# Patient Record
Sex: Male | Born: 1955 | Race: White | Hispanic: No | Marital: Married | Smoking: Never smoker
Health system: Southern US, Community
[De-identification: ages and names within clinical notes are randomized; demographics above are authoritative.]

## PROBLEM LIST (undated history)

## (undated) DIAGNOSIS — E785 Hyperlipidemia, unspecified: Secondary | ICD-10-CM

## (undated) HISTORY — PX: INGUINAL HERNIA REPAIR: SUR1180

## (undated) HISTORY — PX: VASECTOMY: SHX75

## (undated) HISTORY — DX: Hyperlipidemia, unspecified: E78.5

## (undated) HISTORY — PX: WRIST SURGERY: SHX841

---

## 2012-09-30 ENCOUNTER — Encounter: Payer: Self-pay | Admitting: Sports Medicine

## 2012-09-30 ENCOUNTER — Ambulatory Visit (INDEPENDENT_AMBULATORY_CARE_PROVIDER_SITE_OTHER): Payer: Self-pay | Admitting: Sports Medicine

## 2012-09-30 VITALS — BP 148/96 | HR 65 | Wt 194.0 lb

## 2012-09-30 DIAGNOSIS — Z0000 Encounter for general adult medical examination without abnormal findings: Secondary | ICD-10-CM | POA: Insufficient documentation

## 2012-09-30 DIAGNOSIS — Z299 Encounter for prophylactic measures, unspecified: Secondary | ICD-10-CM

## 2012-09-30 DIAGNOSIS — H02829 Cysts of unspecified eye, unspecified eyelid: Secondary | ICD-10-CM

## 2012-09-30 DIAGNOSIS — H02822 Cysts of right lower eyelid: Secondary | ICD-10-CM | POA: Insufficient documentation

## 2012-09-30 DIAGNOSIS — E785 Hyperlipidemia, unspecified: Secondary | ICD-10-CM

## 2012-09-30 MED ORDER — ATORVASTATIN CALCIUM 20 MG PO TABS: 20.0000 mg | ORAL_TABLET | Freq: Every day | ORAL | Status: DC

## 2012-09-30 NOTE — Assessment & Plan Note (Signed)
I am certainly happy to provide a referral to our oculofacial plastic surgeon.  He desires to defer this until a later date.

## 2012-09-30 NOTE — Assessment & Plan Note (Signed)
Starting lipitor 20. Recheck in 3 months.

## 2012-09-30 NOTE — Assessment & Plan Note (Signed)
Up to date on most preventive measures.

## 2012-09-30 NOTE — Progress Notes (Signed)
Subjective:    CC: Establish care.   HPI:  Hyperlipidemia: Was on simvastatin 10 mg, has never had his cholesterol under excellent control.  Nodule on right eyelid: This is been present for a long time, wonders what this could be, and if he needs it taken off.  Past medical history, Surgical history, Family history, Social history, Allergies, and medications have been entered into the medical record, reviewed, and no changes needed.   Review of Systems: No headache, visual changes, nausea, vomiting, diarrhea, constipation, dizziness, abdominal pain, skin rash, fevers, chills, night sweats, swollen lymph nodes, weight loss, chest pain, body aches, joint swelling, muscle aches, shortness of breath, mood changes, visual or auditory hallucinations.  Objective:    General: Well Developed, well nourished, and in no acute distress.  Neuro: Alert and oriented x3, extra-ocular muscles intact, sensation grossly intact.  HEENT: Normocephalic, atraumatic, pupils equal round reactive to light, neck supple, no masses, no lymphadenopathy, thyroid nonpalpable, small nontender nodule palpable on the right lower eyelid near the medial canthus. Skin: Warm and dry, no rashes noted.  Cardiac: Regular rate and rhythm, no murmurs rubs or gallops.  Respiratory: Clear to auscultation bilaterally. Not using accessory muscles, speaking in full sentences.  Abdominal: Soft, nontender, nondistended, positive bowel sounds, no masses, no organomegaly.  Musculoskeletal: Shoulder, elbow, wrist, hip, knee, ankle stable, and with full range of motion.  Impression and Recommendations:    The patient was counselled, risk factors were discussed, anticipatory guidance given.

## 2013-02-02 ENCOUNTER — Ambulatory Visit (INDEPENDENT_AMBULATORY_CARE_PROVIDER_SITE_OTHER): Payer: PRIVATE HEALTH INSURANCE | Admitting: Sports Medicine

## 2013-02-02 ENCOUNTER — Encounter: Payer: Self-pay | Admitting: Sports Medicine

## 2013-02-02 VITALS — BP 112/71 | HR 67 | Temp 98.0°F | Wt 197.0 lb

## 2013-02-02 DIAGNOSIS — K3189 Other diseases of stomach and duodenum: Secondary | ICD-10-CM

## 2013-02-02 DIAGNOSIS — R1013 Epigastric pain: Secondary | ICD-10-CM | POA: Insufficient documentation

## 2013-02-02 LAB — LIPID PANEL
Cholesterol: 167 mg/dL (ref 0–200)
HDL: 66 mg/dL (ref >39)
LDL Cholesterol: 87 mg/dL (ref 0–99)
Total CHOL/HDL Ratio: 2.5 Ratio
Triglycerides: 69 mg/dL (ref <150)
VLDL: 14 mg/dL (ref 0–40)

## 2013-02-02 LAB — COMPREHENSIVE METABOLIC PANEL WITH GFR
Albumin: 4.4 g/dL (ref 3.5–5.2)
Alkaline Phosphatase: 49 U/L (ref 39–117)
BUN: 18 mg/dL (ref 6–23)
CO2: 26 meq/L (ref 19–32)
Calcium: 9.4 mg/dL (ref 8.4–10.5)
Glucose, Bld: 93 mg/dL (ref 70–99)
Potassium: 5 meq/L (ref 3.5–5.3)
Sodium: 137 meq/L (ref 135–145)
Total Protein: 6.5 g/dL (ref 6.0–8.3)

## 2013-02-02 LAB — CBC
HCT: 40.2 % (ref 36.0–46.0)
Hemoglobin: 13.9 g/dL (ref 12.0–15.0)
MCH: 30.5 pg (ref 26.0–34.0)
MCHC: 34.6 g/dL (ref 30.0–36.0)
MCV: 88.2 fL (ref 78.0–100.0)
Platelets: 205 10*3/uL (ref 150–400)
RBC: 4.56 MIL/uL (ref 3.87–5.11)
RDW: 13.6 % (ref 11.5–15.5)
WBC: 6.1 10*3/uL (ref 4.0–10.5)

## 2013-02-02 LAB — COMPREHENSIVE METABOLIC PANEL
ALT: 41 U/L — ABNORMAL HIGH (ref 0–35)
AST: 31 U/L (ref 0–37)
Chloride: 102 mEq/L (ref 96–112)
Creat: 1.22 mg/dL — ABNORMAL HIGH (ref 0.50–1.10)
Total Bilirubin: 0.6 mg/dL (ref 0.3–1.2)

## 2013-02-02 MED ORDER — SUCRALFATE 1 G PO TABS: 1.0000 g | ORAL_TABLET | Freq: Four times a day (QID) | ORAL | Status: DC

## 2013-02-02 MED ORDER — ESOMEPRAZOLE MAGNESIUM 40 MG PO CPDR: 40.0000 mg | DELAYED_RELEASE_CAPSULE | Freq: Two times a day (BID) | ORAL | Status: DC

## 2013-02-02 NOTE — Patient Instructions (Addendum)
Gastritis, Adult Gastritis is soreness and swelling (inflammation) of the lining of the stomach. Gastritis can develop as a sudden onset (acute) or long-term (chronic) condition. If gastritis is not treated, it can lead to stomach bleeding and ulcers. CAUSES  Gastritis occurs when the stomach lining is weak or damaged. Digestive juices from the stomach then inflame the weakened stomach lining. The stomach lining may be weak or damaged due to viral or bacterial infections. One common bacterial infection is the Helicobacter pylori infection. Gastritis can also result from excessive alcohol consumption, taking certain medicines, or having too much acid in the stomach.  SYMPTOMS  In some cases, there are no symptoms. When symptoms are present, they may include:  Pain or a burning sensation in the upper abdomen.  Nausea.  Vomiting.  An uncomfortable feeling of fullness after eating. DIAGNOSIS  Your caregiver may suspect you have gastritis based on your symptoms and a physical exam. To determine the cause of your gastritis, your caregiver may perform the following:  Blood or stool tests to check for the H pylori bacterium.  Gastroscopy. A thin, flexible tube (endoscope) is passed down the esophagus and into the stomach. The endoscope has a light and camera on the end. Your caregiver uses the endoscope to view the inside of the stomach.  Taking a tissue sample (biopsy) from the stomach to examine under a microscope. TREATMENT  Depending on the cause of your gastritis, medicines may be prescribed. If you have a bacterial infection, such as an H pylori infection, antibiotics may be given. If your gastritis is caused by too much acid in the stomach, H2 blockers or antacids may be given. Your caregiver may recommend that you stop taking aspirin, ibuprofen, or other nonsteroidal anti-inflammatory drugs (NSAIDs). HOME CARE INSTRUCTIONS  Only take over-the-counter or prescription medicines as directed by  your caregiver.  If you were given antibiotic medicines, take them as directed. Finish them even if you start to feel better.  Drink enough fluids to keep your urine clear or pale yellow.  Avoid foods and drinks that make your symptoms worse, such as:  Caffeine or alcoholic drinks.  Chocolate.  Peppermint or mint flavorings.  Garlic and onions.  Spicy foods.  Citrus fruits, such as oranges, lemons, or limes.  Tomato-based foods such as sauce, chili, salsa, and pizza.  Fried and fatty foods.  Eat small, frequent meals instead of large meals. SEEK IMMEDIATE MEDICAL CARE IF:   You have black or dark red stools.  You vomit blood or material that looks like coffee grounds.  You are unable to keep fluids down.  Your abdominal pain gets worse.  You have a fever.  You do not feel better after 1 week.  You have any other questions or concerns. MAKE SURE YOU:  Understand these instructions.  Will watch your condition.  Will get help right away if you are not doing well or get worse. Document Released: 09/02/2001 Document Revised: 03/09/2012 Document Reviewed: 10/22/2011 Exit[employer] Patient Information 2013 [employer].  

## 2013-02-02 NOTE — Progress Notes (Signed)
  Subjective:    CC: Followup  HPI: Epigastric pain: For the past few weeks Daryl Hayes has been drinking excessive amounts of alcohol as well as the entire pot of coffee daily. Since then he's noted some mild epigastric pain, associated with bloating and early satiety. He denies any vomiting, diarrhea, melena, hematochezia. No weight loss, no other constitutional symptoms. It is localized, doesn't radiate, mild to moderate.  Hyperlipidemia: No problems so far with Lipitor.  Past medical history, Surgical history, Family history not pertinant except as noted below, Social history, Allergies, and medications have been entered into the medical record, reviewed, and no changes needed.   Review of Systems: No fevers, chills, night sweats, weight loss, chest pain, or shortness of breath.   Objective:    General: Well Developed, well nourished, and in no acute distress.  Neuro: Alert and oriented x3, extra-ocular muscles intact, sensation grossly intact.  HEENT: Normocephalic, atraumatic, pupils equal round reactive to light, neck supple, no masses, no lymphadenopathy, thyroid nonpalpable.  Skin: Warm and dry, no rashes. Cardiac: Regular rate and rhythm, no murmurs rubs or gallops, no lower extremity edema.  Respiratory: Clear to auscultation bilaterally. Not using accessory muscles, speaking in full sentences. Abdomen: Soft, nontender, nondistended, normal bowel sounds, no palpable organomegaly.  Impression and Recommendations:

## 2013-02-02 NOTE — Assessment & Plan Note (Signed)
Adding H. pylori. Nexium twice a day, sucralfate. Return in one month.

## 2013-02-03 LAB — H. PYLORI ANTIBODY, IGG: H Pylori IgG: 0.49 {ISR}

## 2013-10-24 ENCOUNTER — Ambulatory Visit (INDEPENDENT_AMBULATORY_CARE_PROVIDER_SITE_OTHER): Payer: PRIVATE HEALTH INSURANCE | Admitting: Sports Medicine

## 2013-10-24 ENCOUNTER — Encounter: Payer: Self-pay | Admitting: Sports Medicine

## 2013-10-24 VITALS — BP 140/90 | HR 56 | Ht 68.0 in | Wt 192.0 lb

## 2013-10-24 DIAGNOSIS — K3189 Other diseases of stomach and duodenum: Secondary | ICD-10-CM

## 2013-10-24 DIAGNOSIS — R1013 Epigastric pain: Secondary | ICD-10-CM

## 2013-10-24 DIAGNOSIS — E785 Hyperlipidemia, unspecified: Secondary | ICD-10-CM

## 2013-10-24 DIAGNOSIS — M79609 Pain in unspecified limb: Secondary | ICD-10-CM

## 2013-10-24 DIAGNOSIS — M79669 Pain in unspecified lower leg: Secondary | ICD-10-CM | POA: Insufficient documentation

## 2013-10-24 DIAGNOSIS — L989 Disorder of the skin and subcutaneous tissue, unspecified: Secondary | ICD-10-CM | POA: Insufficient documentation

## 2013-10-24 MED ORDER — ESOMEPRAZOLE MAGNESIUM 40 MG PO CPDR: 40.0000 mg | DELAYED_RELEASE_CAPSULE | Freq: Every day | ORAL | Status: DC

## 2013-10-24 MED ORDER — ATORVASTATIN CALCIUM 20 MG PO TABS: 20.0000 mg | ORAL_TABLET | Freq: Every day | ORAL | Status: DC

## 2013-10-24 NOTE — Assessment & Plan Note (Signed)
Good response to Nexium. Refilling. Up-to-date on his colonoscopy.

## 2013-10-24 NOTE — Assessment & Plan Note (Signed)
Refilling atorvastatin

## 2013-10-24 NOTE — Assessment & Plan Note (Signed)
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 Custom orthotics have been shown to improve the symptom. He is training for a 13 mile trail run.

## 2013-10-24 NOTE — Assessment & Plan Note (Signed)
We will watch this for a month, if persistent we will biopsy it.

## 2013-10-24 NOTE — Progress Notes (Signed)
  Subjective:    CC: Followup  HPI: Dyspepsia: Excellent response to Nexium, unfortunately he stopped it or symptoms returned. He is up-to-date on his colon cancer screening. He also had no anemia on recent blood work, H. pylori antibody was negative.  Lower extremity pain: Bilateral, localized over the anterior compartment and posterior medial tibial border, he is currently training for a 13 mile trail run. It is moderate, persistent. When he gets pain over the anterior shin he does develop some footdrop. He also has an increased sense of pressure.  Past medical history, Surgical history, Family history not pertinant except as noted below, Social history, Allergies, and medications have been entered into the medical record, reviewed, and no changes needed.   Review of Systems: No fevers, chills, night sweats, weight loss, chest pain, or shortness of breath.   Objective:    General: Well Developed, well nourished, and in no acute distress.  Neuro: Alert and oriented x3, extra-ocular muscles intact, sensation grossly intact.  HEENT: Normocephalic, atraumatic, pupils equal round reactive to light, neck supple, no masses, no lymphadenopathy, thyroid nonpalpable.  Skin: Warm and dry, no rashes. Cardiac: Regular rate and rhythm, no murmurs rubs or gallops, no lower extremity edema.  Respiratory: Clear to auscultation bilaterally. Not using accessory muscles, speaking in full sentences. Shins: No discrete areas of tenderness to palpation. Hip abductor strength is excellent. No visible abnormalities.  Impression and Recommendations:

## 2013-10-25 ENCOUNTER — Encounter: Payer: Self-pay | Admitting: Sports Medicine

## 2013-10-25 ENCOUNTER — Ambulatory Visit (INDEPENDENT_AMBULATORY_CARE_PROVIDER_SITE_OTHER): Payer: PRIVATE HEALTH INSURANCE | Admitting: Sports Medicine

## 2013-10-25 VITALS — BP 122/71 | HR 74 | Ht 68.0 in | Wt 191.0 lb

## 2013-10-25 DIAGNOSIS — M79669 Pain in unspecified lower leg: Secondary | ICD-10-CM

## 2013-10-25 DIAGNOSIS — R1013 Epigastric pain: Secondary | ICD-10-CM

## 2013-10-25 MED ORDER — OMEPRAZOLE 40 MG PO CPDR: 40.0000 mg | DELAYED_RELEASE_CAPSULE | Freq: Two times a day (BID) | ORAL | Status: DC

## 2013-10-25 NOTE — Assessment & Plan Note (Signed)
Nexium was too expensive, switching to omeprazole.

## 2013-10-25 NOTE — Assessment & Plan Note (Signed)
Suspect medial tibial stress syndrome and some exertional anterior compartment syndrome. Custom orthotics as above. He can continue to drain for his 13 mile run.

## 2013-10-25 NOTE — Progress Notes (Signed)
    Patient was fitted for a : standard, cushioned, semi-rigid orthotic. The orthotic was heated and afterward the patient stood on the orthotic blank positioned on the orthotic stand. The patient was positioned in subtalar neutral position and 10 degrees of ankle dorsiflexion in a weight bearing stance. After completion of molding, a stable base was applied to the orthotic blank. The blank was ground to a stable position for weight bearing. Size:10 Base: Blue EVA Additional Posting and Padding: None The patient ambulated these, and they were very comfortable.  I spent 40 minutes with this patient, greater than 50% was face-to-face time counseling regarding the below diagnosis.   

## 2014-02-28 ENCOUNTER — Telehealth: Payer: Self-pay

## 2014-02-28 NOTE — Telephone Encounter (Signed)
Patient called stated that he is doing a run on the 19th on this month in [place] and he wanted to know if there is any medication that he should be taking because it will be a high altitude? Rhonda Cunningham,CMA

## 2014-02-28 NOTE — Telephone Encounter (Signed)
What is the altitude that he expects to be running at?

## 2014-03-01 MED ORDER — ACETAZOLAMIDE 125 MG PO TABS: ORAL_TABLET | ORAL | Status: DC

## 2014-03-01 NOTE — Telephone Encounter (Signed)
Spoke to patient gave him instructions on new medication as noted below. Jaimie Pippins,CMA

## 2014-03-01 NOTE — Telephone Encounter (Signed)
We are going to start Acetazolamide 125 mg twice a day started 24 hours before ascent, and continued 2 days after arrival at the peak.

## 2014-03-01 NOTE — Telephone Encounter (Signed)
Spoke to patient he stated that he will be going up to [id number]. Rhonda Cunningham,CMA

## 2014-04-09 ENCOUNTER — Other Ambulatory Visit: Payer: Self-pay | Admitting: Sports Medicine

## 2014-10-03 ENCOUNTER — Ambulatory Visit (INDEPENDENT_AMBULATORY_CARE_PROVIDER_SITE_OTHER): Payer: PRIVATE HEALTH INSURANCE | Admitting: Sports Medicine

## 2014-10-03 ENCOUNTER — Encounter: Payer: Self-pay | Admitting: Sports Medicine

## 2014-10-03 VITALS — BP 146/99 | HR 58 | Ht 68.0 in | Wt 191.0 lb

## 2014-10-03 DIAGNOSIS — B372 Candidiasis of skin and nail: Secondary | ICD-10-CM

## 2014-10-03 DIAGNOSIS — L0231 Cutaneous abscess of buttock: Secondary | ICD-10-CM

## 2014-10-03 MED ORDER — DOXYCYCLINE HYCLATE 100 MG PO TABS: 100.0000 mg | ORAL_TABLET | Freq: Two times a day (BID) | ORAL | Status: AC

## 2014-10-03 MED ORDER — CLOTRIMAZOLE-BETAMETHASONE 1-0.05 % EX CREA: 1.0000 "application " | TOPICAL_CREAM | Freq: Two times a day (BID) | CUTANEOUS | Status: DC

## 2014-10-03 NOTE — Patient Instructions (Signed)
Peri-Rectal Abscess  Your caregiver has diagnosed you as having a peri-rectal abscess. This is an infected area near the rectum that is filled with pus. If the abscess is near the surface of the skin, your caregiver may open (incise) the area and drain the pus.  HOME CARE INSTRUCTIONS    If your abscess was opened up and drained. A small piece of gauze may be placed in the opening so that it can drain. Do not remove the gauze unless directed by your caregiver.   A loose dressing may be placed over the abscess site. Change the dressing as often as necessary to keep it clean and dry.   After the drain is removed, the area may be washed with a gentle antiseptic (soap) four times per day.   A warm sitz bath, warm packs or heating pad may be used for pain relief, taking care not to burn yourself.   Return for a wound check in 1 day or as directed.   An "inflatable doughnut" may be used for sitting with added comfort. These can be purchased at a drugstore or medical supply house.   To reduce pain and straining with bowel movements, eat a high fiber diet with plenty of fruits and vegetables. Use stool softeners as recommended by your caregiver. This is especially important if narcotic type pain medications were prescribed as these may cause marked constipation.   Only take over-the-counter or prescription medicines for pain, discomfort, or fever as directed by your caregiver.  SEEK IMMEDIATE MEDICAL CARE IF:    You have increasing pain that is not controlled by medication.   There is increased inflammation (redness), swelling, bleeding, or drainage from the area.   An oral temperature above 102 F (38.9 C) develops.   You develop chills or generalized malaise (feel lethargic or feel "washed out").   You develop any new symptoms (problems) you feel may be related to your present problem.  Document Released: 09/05/2000 Document Revised: 12/01/2011 Document Reviewed: 09/05/2008  [employer] Patient Information  2015 [employer]. This information is not intended to replace advice given to you by your health care provider. Make sure you discuss any questions you have with your health care provider.

## 2014-10-03 NOTE — Assessment & Plan Note (Signed)
Present in the left gluteal region, nontender. This likely represents a skin abscess. Doxycycline for 10 days, return, if no better we will image and likely perform an incision and drainage.

## 2014-10-03 NOTE — Progress Notes (Signed)
  Subjective:    CC: Lump on buttock  HPI: This is a pleasant 59 year old male, for several weeks he's noted a minimally tender lump on his left buttock, approximately 2 inches from the anus. Symptoms are moderate, persistent.  Rash: Also localized over the anus and on the buttock's bilaterally, he was told by dermatologist in the past that this represents a fungal infection, and was given a cream that cleared it up.  Past medical history, Surgical history, Family history not pertinant except as noted below, Social history, Allergies, and medications have been entered into the medical record, reviewed, and no changes needed.   Review of Systems: No fevers, chills, night sweats, weight loss, chest pain, or shortness of breath.   Objective:    General: Well Developed, well nourished, and in no acute distress.  Neuro: Alert and oriented x3, extra-ocular muscles intact, sensation grossly intact.  HEENT: Normocephalic, atraumatic, pupils equal round reactive to light, neck supple, no masses, no lymphadenopathy, thyroid nonpalpable.  Skin: Warm and dry, no rashes. Cardiac: Regular rate and rhythm, no murmurs rubs or gallops, no lower extremity edema.  Respiratory: Clear to auscultation bilaterally. Not using accessory muscles, speaking in full sentences. Buttock: There is what appears to be a candidal intertrigo with scaling. There is also a palpable 2-3 cm nodule that is fluctuant, well-defined, and movable, but only minimally tender on the left buttock approximately 2 inches from the anus.  Impression and Recommendations:

## 2014-10-03 NOTE — Assessment & Plan Note (Signed)
Lotrisone cream twice a day.

## 2014-11-05 ENCOUNTER — Other Ambulatory Visit: Payer: Self-pay | Admitting: Sports Medicine

## 2014-11-06 ENCOUNTER — Ambulatory Visit (INDEPENDENT_AMBULATORY_CARE_PROVIDER_SITE_OTHER): Payer: PRIVATE HEALTH INSURANCE

## 2014-11-06 ENCOUNTER — Ambulatory Visit (INDEPENDENT_AMBULATORY_CARE_PROVIDER_SITE_OTHER): Payer: PRIVATE HEALTH INSURANCE | Admitting: Sports Medicine

## 2014-11-06 ENCOUNTER — Encounter: Payer: Self-pay | Admitting: Sports Medicine

## 2014-11-06 VITALS — BP 148/97 | HR 71 | Temp 98.1°F | Ht 68.0 in | Wt 190.0 lb

## 2014-11-06 DIAGNOSIS — R05 Cough: Secondary | ICD-10-CM

## 2014-11-06 DIAGNOSIS — R059 Cough, unspecified: Secondary | ICD-10-CM | POA: Insufficient documentation

## 2014-11-06 DIAGNOSIS — R062 Wheezing: Secondary | ICD-10-CM

## 2014-11-06 DIAGNOSIS — L0231 Cutaneous abscess of buttock: Secondary | ICD-10-CM

## 2014-11-06 MED ORDER — IPRATROPIUM-ALBUTEROL 20-100 MCG/ACT IN AERS: 1.0000 | INHALATION_SPRAY | Freq: Four times a day (QID) | RESPIRATORY_TRACT | Status: DC | PRN

## 2014-11-06 MED ORDER — METHYLPREDNISOLONE SODIUM SUCC 125 MG IJ SOLR: 125.0000 mg | Freq: Once | INTRAMUSCULAR | Status: DC

## 2014-11-06 MED ORDER — CEFTRIAXONE SODIUM 1 G IJ SOLR
1.0000 g | Freq: Once | INTRAMUSCULAR | Status: AC
  Administered 2014-11-06: 1 g via INTRAMUSCULAR

## 2014-11-06 MED ORDER — BENZONATATE 200 MG PO CAPS: 200.0000 mg | ORAL_CAPSULE | Freq: Three times a day (TID) | ORAL | Status: DC | PRN

## 2014-11-06 MED ORDER — PREDNISONE 50 MG PO TABS: 50.0000 mg | ORAL_TABLET | Freq: Every day | ORAL | Status: DC

## 2014-11-06 MED ORDER — SODIUM CHLORIDE 0.9 % IV SOLN: 125.0000 mg | Freq: Once | INTRAVENOUS | Status: DC

## 2014-11-06 MED ORDER — AZITHROMYCIN 250 MG PO TABS: ORAL_TABLET | ORAL | Status: DC

## 2014-11-06 MED ORDER — METHYLPREDNISOLONE SODIUM SUCC 125 MG IJ SOLR
125.0000 mg | Freq: Once | INTRAMUSCULAR | Status: AC
  Administered 2014-11-06: 125 mg via INTRAMUSCULAR

## 2014-11-06 NOTE — Assessment & Plan Note (Signed)
Resolved with antibiotics last month.

## 2014-11-06 NOTE — Assessment & Plan Note (Signed)
Significant inspiratory and expiratory wheezes with coarse sounds throughout, and significantly decreased expiratory phase. This likely does represent an acute bronchitis, he is not going to be able to get medicines until tomorrow or the day after so we are going to give him Solu-Medrol 125 and Rocephin 1 g intramuscular today. Nebulizer treatment given today, Atrovent and albuterol. Prednisone, azithromycin, chest x-ray, Tessalon Perles. Return to see me in one month, for reevaluation.

## 2014-11-06 NOTE — Progress Notes (Signed)
  Subjective:    CC:  Coughing  HPI: For the past week and a half Coery has had significant cough, productive, with mild malaise, without any rashes, GI symptoms, or constitutional symptoms. Symptoms are moderate, persistent.  Past medical history, Surgical history, Family history not pertinant except as noted below, Social history, Allergies, and medications have been entered into the medical record, reviewed, and no changes needed.   Review of Systems: No fevers, chills, night sweats, weight loss, chest pain, or shortness of breath.   Objective:    General: Well Developed, well nourished, and in no acute distress.  Neuro: Alert and oriented x3, extra-ocular muscles intact, sensation grossly intact.  HEENT: Normocephalic, atraumatic, pupils equal round reactive to light, neck supple, no masses, no lymphadenopathy, thyroid nonpalpable.  Skin: Warm and dry, no rashes. Cardiac: Regular rate and rhythm, no murmurs rubs or gallops, no lower extremity edema.  Respiratory: Coarse sounds bilaterally with inspiratory and expiratory wheezes and a significantly prolonged expiratory phase.. Not using accessory muscles, speaking in full sentences.  Solu-Medrol, Rocephin, and albuterol and Atrovent nebs given today in the office.  Chest x-ray shows increased right basilar markings suggestive of bronchitis but no overt infiltrate.  Impression and Recommendations:

## 2015-02-11 ENCOUNTER — Other Ambulatory Visit: Payer: Self-pay | Admitting: Sports Medicine

## 2015-05-10 ENCOUNTER — Ambulatory Visit (INDEPENDENT_AMBULATORY_CARE_PROVIDER_SITE_OTHER): Payer: PRIVATE HEALTH INSURANCE | Admitting: Sports Medicine

## 2015-05-10 ENCOUNTER — Encounter: Payer: PRIVATE HEALTH INSURANCE | Admitting: Sports Medicine

## 2015-05-10 ENCOUNTER — Encounter: Payer: Self-pay | Admitting: Sports Medicine

## 2015-05-10 VITALS — BP 163/107 | HR 83 | Ht 68.0 in | Wt 197.0 lb

## 2015-05-10 DIAGNOSIS — L989 Disorder of the skin and subcutaneous tissue, unspecified: Secondary | ICD-10-CM

## 2015-05-10 DIAGNOSIS — Z0000 Encounter for general adult medical examination without abnormal findings: Secondary | ICD-10-CM

## 2015-05-10 DIAGNOSIS — R635 Abnormal weight gain: Secondary | ICD-10-CM

## 2015-05-10 DIAGNOSIS — R1013 Epigastric pain: Secondary | ICD-10-CM

## 2015-05-10 DIAGNOSIS — E785 Hyperlipidemia, unspecified: Secondary | ICD-10-CM | POA: Diagnosis not present

## 2015-05-10 MED ORDER — PHENTERMINE HCL 37.5 MG PO TABS: ORAL_TABLET | ORAL | Status: DC

## 2015-05-10 NOTE — Assessment & Plan Note (Signed)
Stable, rechecking lipids 

## 2015-05-10 NOTE — Assessment & Plan Note (Signed)
Starting phentermine, return in one month, ordering routine blood work.

## 2015-05-10 NOTE — Assessment & Plan Note (Signed)
Normal physical exam with the exception of a couple of skin lesions, negative heme occult.

## 2015-05-10 NOTE — Assessment & Plan Note (Signed)
There are what appear to be 3 actinic keratoses on his right arm, he will return for excision.

## 2015-05-10 NOTE — Assessment & Plan Note (Signed)
Stable on omeprazole. 

## 2015-05-10 NOTE — Progress Notes (Signed)
  Subjective:    CC: complete physical  HPI:  Abnormal weight gain: Has gained approximately 20 pounds over the past several months, desires help with weight loss, he has tried dieting and exercise.  Skin lesion: Right arm, there are 3 lesions he wants me to look at.  Hyperlipidemia: Stable on Lipitor.  Dyspepsia: Stable on omeprazole.  Past medical history, Surgical history, Family history not pertinant except as noted below, Social history, Allergies, and medications have been entered into the medical record, reviewed, and no changes needed.   Review of Systems: No headache, visual changes, nausea, vomiting, diarrhea, constipation, dizziness, abdominal pain, skin rash, fevers, chills, night sweats, swollen lymph nodes, weight loss, chest pain, body aches, joint swelling, muscle aches, shortness of breath, mood changes, visual or auditory hallucinations.  Objective:    General: Well Developed, well nourished, and in no acute distress.  Neuro: Alert and oriented x3, extra-ocular muscles intact, sensation grossly intact. Cranial nerves II through XII are intact, motor, sensory, and coordinative functions are all intact. HEENT: Normocephalic, atraumatic, pupils equal round reactive to light, neck supple, no masses, no lymphadenopathy, thyroid nonpalpable. Oropharynx, nasopharynx, external ear canals are unremarkable. Skin: Warm and dry, no rashes noted. There are 3 papular lesions that appear to be actinic keratoses on the right forearm Cardiac: Regular rate and rhythm, no murmurs rubs or gallops.  Respiratory: Clear to auscultation bilaterally. Not using accessory muscles, speaking in full sentences.  Abdominal: Soft, nontender, nondistended, positive bowel sounds, no masses, no organomegaly.  Musculoskeletal: Shoulder, elbow, wrist, hip, knee, ankle stable, and with full range of motion.  Impression and Recommendations:    The patient was counselled, risk factors were discussed,  anticipatory guidance given.

## 2015-05-15 ENCOUNTER — Telehealth: Payer: Self-pay

## 2015-05-15 NOTE — Telephone Encounter (Signed)
Patient called stated that Phentermine is too high in the dosage so he wants to know if he can start a half tablet and then work his way up to a full tablet.advised patient to take 1/2 tablet for 1 week then work his way to a full tablet. Arietta Eisenstein,CMA

## 2015-06-12 ENCOUNTER — Ambulatory Visit (INDEPENDENT_AMBULATORY_CARE_PROVIDER_SITE_OTHER): Payer: PRIVATE HEALTH INSURANCE | Admitting: Sports Medicine

## 2015-06-12 ENCOUNTER — Encounter: Payer: Self-pay | Admitting: Sports Medicine

## 2015-06-12 VITALS — BP 173/101 | HR 67 | Ht 68.0 in | Wt 189.0 lb

## 2015-06-12 DIAGNOSIS — R635 Abnormal weight gain: Secondary | ICD-10-CM | POA: Diagnosis not present

## 2015-06-12 DIAGNOSIS — L989 Disorder of the skin and subcutaneous tissue, unspecified: Secondary | ICD-10-CM

## 2015-06-12 MED ORDER — PHENTERMINE HCL 37.5 MG PO TABS: ORAL_TABLET | ORAL | Status: DC

## 2015-06-12 NOTE — Assessment & Plan Note (Signed)
Dermatologist date cryotherapy on one the lesions, I performed cryotherapy on the other one.

## 2015-06-12 NOTE — Assessment & Plan Note (Signed)
Good weight loss, refilling phentermine, he will switch to one half tab twice a day. Return in one month for a weight check and refills.

## 2015-06-12 NOTE — Progress Notes (Signed)
  Subjective:    CC: Skin lesion  HPI: Abnormal weight gain: Good weight loss after the first month on half a tablet phentermine. No side effects.  Skin lesions: Appear to be actinic keratoses of the last visit, he did go to the dermatologist and had a cecal one frozen, he does have an additional one on the right arm that he would like cryotherapy.  Past medical history, Surgical history, Family history not pertinant except as noted below, Social history, Allergies, and medications have been entered into the medical record, reviewed, and no changes needed.   Review of Systems: No fevers, chills, night sweats, weight loss, chest pain, or shortness of breath.   Objective:    General: Well Developed, well nourished, and in no acute distress.  Neuro: Alert and oriented x3, extra-ocular muscles intact, sensation grossly intact.  HEENT: Normocephalic, atraumatic, pupils equal round reactive to light, neck supple, no masses, no lymphadenopathy, thyroid nonpalpable.  Skin: Warm and dry,benign-appearing seborrheic keratosis on the right forearm  Cardiac: Regular rate and rhythm, no murmurs rubs or gallops, no lower extremity edema.  Respiratory: Clear to auscultation bilaterally. Not using accessory muscles, speaking in full sentences.  Procedure:  Cryodestruction of right forearm seborrheic keratosis Consent obtained and verified. Time-out conducted. Noted no overlying erythema, induration, or other signs of local infection. Completed without difficulty using Cryo-Gun. Advised to call if fevers/chills, erythema, induration, drainage, or persistent bleeding.  Impression and Recommendations:

## 2015-07-11 ENCOUNTER — Ambulatory Visit: Payer: PRIVATE HEALTH INSURANCE | Admitting: Sports Medicine

## 2015-07-31 ENCOUNTER — Other Ambulatory Visit: Payer: Self-pay | Admitting: Sports Medicine

## 2015-08-10 ENCOUNTER — Ambulatory Visit: Payer: PRIVATE HEALTH INSURANCE | Admitting: Sports Medicine

## 2016-05-05 ENCOUNTER — Encounter: Payer: Self-pay | Admitting: Sports Medicine

## 2016-05-05 ENCOUNTER — Ambulatory Visit (INDEPENDENT_AMBULATORY_CARE_PROVIDER_SITE_OTHER): Payer: 59 | Admitting: Sports Medicine

## 2016-05-05 ENCOUNTER — Other Ambulatory Visit: Payer: Self-pay | Admitting: Sports Medicine

## 2016-05-05 DIAGNOSIS — R1013 Epigastric pain: Secondary | ICD-10-CM | POA: Diagnosis not present

## 2016-05-05 DIAGNOSIS — G4730 Sleep apnea, unspecified: Secondary | ICD-10-CM | POA: Insufficient documentation

## 2016-05-05 DIAGNOSIS — E785 Hyperlipidemia, unspecified: Secondary | ICD-10-CM

## 2016-05-05 DIAGNOSIS — Z0000 Encounter for general adult medical examination without abnormal findings: Secondary | ICD-10-CM | POA: Diagnosis not present

## 2016-05-05 LAB — COMPREHENSIVE METABOLIC PANEL WITH GFR
Albumin: 4.3 g/dL (ref 3.6–5.1)
CO2: 28 mmol/L (ref 20–31)
Calcium: 9.6 mg/dL (ref 8.6–10.3)
Chloride: 103 mmol/L (ref 98–110)
Potassium: 5.3 mmol/L (ref 3.5–5.3)
Total Bilirubin: 0.7 mg/dL (ref 0.2–1.2)

## 2016-05-05 LAB — CBC
HCT: 42.1 % (ref 38.5–50.0)
Hemoglobin: 14.8 g/dL (ref 13.2–17.1)
MCH: 30.6 pg (ref 27.0–33.0)
MCHC: 35.2 g/dL (ref 32.0–36.0)
MCV: 87.2 fL (ref 80.0–100.0)
MPV: 9.8 fL (ref 7.5–12.5)
Platelets: 224 10*3/uL (ref 140–400)
RBC: 4.83 MIL/uL (ref 4.20–5.80)
RDW: 13 % (ref 11.0–15.0)
WBC: 4.1 K/uL (ref 3.8–10.8)

## 2016-05-05 LAB — COMPREHENSIVE METABOLIC PANEL
ALT: 34 U/L (ref 9–46)
AST: 23 U/L (ref 10–35)
Alkaline Phosphatase: 48 U/L (ref 40–115)
BUN: 17 mg/dL (ref 7–25)
Creat: 1.03 mg/dL (ref 0.70–1.33)
Glucose, Bld: 99 mg/dL (ref 65–99)
Sodium: 139 mmol/L (ref 135–146)
Total Protein: 6.9 g/dL (ref 6.1–8.1)

## 2016-05-05 LAB — TSH: TSH: 2.02 m[IU]/L (ref 0.40–4.50)

## 2016-05-05 LAB — LDL CHOLESTEROL, DIRECT: Direct LDL: 197 mg/dL — ABNORMAL HIGH (ref <130)

## 2016-05-05 NOTE — Assessment & Plan Note (Signed)
Having some bruxism at night, and daytime sleepiness. We are going to pull the trigger for a home sleep study.

## 2016-05-05 NOTE — Progress Notes (Signed)
  Subjective:    CC: Complete physical exam  HPI:  This pleasant 60 year old male is here for his physical, he has self discontinued his Lipitor after a few weekends of heavy drinking, he is fasting today.  Daytime sleepiness: Persistent, with bruxism. Needs a sleep study.  Past medical history, Surgical history, Family history not pertinant except as noted below, Social history, Allergies, and medications have been entered into the medical record, reviewed, and no changes needed.   Review of Systems: No headache, visual changes, nausea, vomiting, diarrhea, constipation, dizziness, abdominal pain, skin rash, fevers, chills, night sweats, swollen lymph nodes, weight loss, chest pain, body aches, joint swelling, muscle aches, shortness of breath, mood changes, visual or auditory hallucinations.  Objective:    General: Well Developed, well nourished, and in no acute distress.  Neuro: Alert and oriented x3, extra-ocular muscles intact, sensation grossly intact. Cranial nerves II through XII are intact, motor, sensory, and coordinative functions are all intact. HEENT: Normocephalic, atraumatic, pupils equal round reactive to light, neck supple, no masses, no lymphadenopathy, thyroid nonpalpable. Oropharynx, nasopharynx, external ear canals are unremarkable. Skin: Warm and dry, no rashes noted.  Cardiac: Regular rate and rhythm, no murmurs rubs or gallops.  Respiratory: Clear to auscultation bilaterally. Not using accessory muscles, speaking in full sentences.  Abdominal: Soft, nontender, nondistended, positive bowel sounds, no masses, no organomegaly.  Musculoskeletal: Shoulder, elbow, wrist, hip, knee, ankle stable, and with full range of motion.  Impression and Recommendations:    The patient was counselled, risk factors were discussed, anticipatory guidance given.  Annual physical exam Annual physical as above. Routine blood work ordered. His job would like us to check nicotine and  cotinine levels as well.  Hyperlipidemia Has been off of Lipitor, rechecking lipids and LFTs.  Dyspepsia Has discontinued Prilosec and is doing well.  Sleep apnea Having some bruxism at night, and daytime sleepiness. We are going to pull the trigger for a home sleep study.

## 2016-05-05 NOTE — Assessment & Plan Note (Signed)
Annual physical as above. Routine blood work ordered. His job would like us to check nicotine and cotinine levels as well.

## 2016-05-05 NOTE — Assessment & Plan Note (Signed)
Has discontinued Prilosec and is doing well.

## 2016-05-05 NOTE — Assessment & Plan Note (Signed)
Has been off of Lipitor, rechecking lipids and LFTs.

## 2016-05-06 LAB — HEMOGLOBIN A1C
Hgb A1c MFr Bld: 5.2 % (ref <5.7)
Mean Plasma Glucose: 103 mg/dL

## 2016-05-06 LAB — NICOTINE/COTININE METABOLITES: Cotinine: NEGATIVE

## 2016-05-08 LAB — LIPID PANEL
Cholesterol: 286 mg/dL — ABNORMAL HIGH (ref 125–200)
HDL: 79 mg/dL (ref >=40)
LDL Cholesterol: 187 mg/dL — ABNORMAL HIGH (ref <130)
Total CHOL/HDL Ratio: 3.6 Ratio (ref <=5.0)
Triglycerides: 98 mg/dL (ref <150)
VLDL: 20 mg/dL (ref <30)

## 2016-05-27 ENCOUNTER — Ambulatory Visit (INDEPENDENT_AMBULATORY_CARE_PROVIDER_SITE_OTHER): Payer: 59 | Admitting: Sports Medicine

## 2016-05-27 ENCOUNTER — Ambulatory Visit (INDEPENDENT_AMBULATORY_CARE_PROVIDER_SITE_OTHER): Payer: 59

## 2016-05-27 ENCOUNTER — Encounter: Payer: Self-pay | Admitting: Sports Medicine

## 2016-05-27 DIAGNOSIS — S46011A Strain of muscle(s) and tendon(s) of the rotator cuff of right shoulder, initial encounter: Secondary | ICD-10-CM | POA: Diagnosis not present

## 2016-05-27 DIAGNOSIS — M25511 Pain in right shoulder: Secondary | ICD-10-CM | POA: Diagnosis not present

## 2016-05-27 DIAGNOSIS — M7521 Bicipital tendinitis, right shoulder: Secondary | ICD-10-CM | POA: Diagnosis not present

## 2016-05-27 DIAGNOSIS — W19XXXA Unspecified fall, initial encounter: Secondary | ICD-10-CM

## 2016-05-27 DIAGNOSIS — S46811A Strain of other muscles, fascia and tendons at shoulder and upper arm level, right arm, initial encounter: Secondary | ICD-10-CM

## 2016-05-27 NOTE — Assessment & Plan Note (Addendum)
Severe pain and 0/5 strength external rotation. X-rays, stat MRI, I do suspect he has a full infraspinatus rupture.  MRI shows high-grade tearing of the infraspinatus and supraspinatus tendons as well as musculotendinous tears in the infraspinatus. Considering the acuity of the injury and his weakness we are going to send him to shoulder surgery sooner rather than later.

## 2016-05-27 NOTE — Progress Notes (Signed)
  Subjective:    CC: Right shoulder injury  HPI: A few days ago this pleasant 60 year old male fell while hiking, he had immediate pain and difficulty using his right shoulder. He presents today with severe pain in the posterior aspect of the shoulder with great difficulty with external rotation as well as abduction and forward flexion. Pain is severe, persistent, no radiation, no paresthesias.  Past medical history, Surgical history, Family history not pertinant except as noted below, Social history, Allergies, and medications have been entered into the medical record, reviewed, and no changes needed.   Review of Systems: No fevers, chills, night sweats, weight loss, chest pain, or shortness of breath.   Objective:    General: Well Developed, well nourished, and in no acute distress.  Neuro: Alert and oriented x3, extra-ocular muscles intact, sensation grossly intact.  HEENT: Normocephalic, atraumatic, pupils equal round reactive to light, neck supple, no masses, no lymphadenopathy, thyroid nonpalpable.  Skin: Warm and dry, no rashes. Cardiac: Regular rate and rhythm, no murmurs rubs or gallops, no lower extremity edema.  Respiratory: Clear to auscultation bilaterally. Not using accessory muscles, speaking in full sentences. Right shoulder: 3-/5 strength to external rotation, 3+/5 strength to forward flexion. Positive Hawkins, neer signs.  X-ray reviewed and was negative, MRI shows high-grade tearing, full-thickness of the infraspinatus, supraspinatus, as well as musculotendinous tearing of the infraspinatus.  Impression and Recommendations:    Tear of right infraspinatus tendon Severe pain and 0/5 strength external rotation. X-rays, stat MRI, I do suspect he has a full infraspinatus rupture.  MRI shows high-grade tearing of the infraspinatus and supraspinatus tendons as well as musculotendinous tears in the infraspinatus. Considering the acuity of the injury and his weakness we are  going to send him to shoulder surgery sooner rather than later.  I spent 25 minutes with this patient, greater than 50% was face-to-face time counseling regarding the above diagnoses

## 2016-05-28 ENCOUNTER — Encounter: Payer: Self-pay | Admitting: Sports Medicine

## 2016-08-21 ENCOUNTER — Ambulatory Visit (INDEPENDENT_AMBULATORY_CARE_PROVIDER_SITE_OTHER): Payer: 59

## 2016-08-21 ENCOUNTER — Ambulatory Visit (INDEPENDENT_AMBULATORY_CARE_PROVIDER_SITE_OTHER): Payer: 59 | Admitting: Sports Medicine

## 2016-08-21 VITALS — BP 135/87 | HR 78 | Resp 18 | Wt 196.3 lb

## 2016-08-21 DIAGNOSIS — J069 Acute upper respiratory infection, unspecified: Secondary | ICD-10-CM | POA: Diagnosis not present

## 2016-08-21 DIAGNOSIS — R05 Cough: Secondary | ICD-10-CM | POA: Diagnosis not present

## 2016-08-21 DIAGNOSIS — R059 Cough, unspecified: Secondary | ICD-10-CM

## 2016-08-21 DIAGNOSIS — R062 Wheezing: Secondary | ICD-10-CM

## 2016-08-21 DIAGNOSIS — J209 Acute bronchitis, unspecified: Secondary | ICD-10-CM

## 2016-08-21 MED ORDER — OXYMETAZOLINE HCL 0.05 % NA SOLN: 1.0000 | Freq: Two times a day (BID) | NASAL | 0 refills | Status: DC

## 2016-08-21 MED ORDER — ALBUTEROL SULFATE HFA 108 (90 BASE) MCG/ACT IN AERS: 1.0000 | INHALATION_SPRAY | Freq: Four times a day (QID) | RESPIRATORY_TRACT | 0 refills | Status: DC | PRN

## 2016-08-21 MED ORDER — AZITHROMYCIN 250 MG PO TABS: ORAL_TABLET | ORAL | 0 refills | Status: DC

## 2016-08-21 NOTE — Progress Notes (Signed)
Subjective:    Patient ID: Daryl Hayes, male    DOB: June 30, 1956, 60 y.o.   MRN: [id number]  Cough  This is a new problem. Episode onset: 3 weeks ago. The problem has been unchanged. The problem occurs every few hours. The cough is productive of sputum. Associated symptoms include nasal congestion and wheezing. Pertinent negatives include no chest pain, chills, ear pain, fever, headaches, myalgias, rash, sore throat or shortness of breath. Associated symptoms comments: sneezing. Nothing aggravates the symptoms. Treatments tried: Nyquil/Dayquil. The treatment provided mild relief. His past medical history is significant for bronchitis. There is no history of asthma, COPD or environmental allergies.      Review of Systems  Constitutional: Positive for appetite change (loss of taste). Negative for chills and fever.  HENT: Positive for congestion, sinus pressure and sneezing. Negative for ear pain, sinus pain and sore throat.   Eyes: Negative for discharge and itching.  Respiratory: Positive for cough and wheezing. Negative for shortness of breath.   Cardiovascular: Negative for chest pain.  Gastrointestinal: Negative for abdominal pain, nausea and vomiting.  Musculoskeletal: Negative for arthralgias and myalgias.  Skin: Negative for rash.  Allergic/Immunologic: Negative for environmental allergies.  Neurological: Negative for headaches.       Objective:   Physical Exam  Constitutional: He is oriented to person, place, and time. No distress.  HENT:  Right Ear: External ear normal.  Left Ear: External ear normal.  Nose: Nose normal.  Mouth/Throat: Oropharynx is clear and moist. No oropharyngeal exudate.  Eyes: Conjunctivae and EOM are normal.  Neck: Neck supple.  Cardiovascular: Normal rate, regular rhythm and normal heart sounds.   Pulmonary/Chest: Effort normal. No respiratory distress. He has wheezes.  Diffuse bronchial breathe sounds throughout left lobes  Lymphadenopathy:   He has no cervical adenopathy.  Neurological: He is alert and oriented to person, place, and time.  Skin: Skin is warm and dry. No rash noted.  Vitals reviewed.   Vitals:   08/21/16 1450  BP: 135/87  Pulse: 78  Resp: 18    Reviewed CXR. No focal opacities, pleural effusion, or pneumothorax.    Assessment & Plan:   1. Acute upper respiratory infection - albuterol (PROVENTIL HFA;VENTOLIN HFA) 108 (90 Base) MCG/ACT inhaler; Inhale 1-2 puffs into the lungs every 6 (six) hours as needed for wheezing.  Dispense: 1 Inhaler; Refill: 0 - azithromycin (ZITHROMAX Z-PAK) 250 MG tablet; Take 2 tablets (500 mg) on  Day 1,  followed by 1 tablet (250 mg) once daily on Days 2 through 5.  Dispense: 6 tablet; Refill: 0 - oxymetazoline (AFRIN) 0.05 % nasal spray; Place 1 spray into both nostrils 2 (two) times daily. DO NOT USE FOR MORE THAN 3 DAYS (can cause rebound rhinitis)  Dispense: 30 mL; Refill: 0  2. Acute bronchitis, unspecified organism - DG Chest 2 View; Future - albuterol (PROVENTIL HFA;VENTOLIN HFA) 108 (90 Base) MCG/ACT inhaler; Inhale 1-2 puffs into the lungs every 6 (six) hours as needed for wheezing.  Dispense: 1 Inhaler; Refill: 0   Symptomatic management with OTC meds as needed:  Guaifenesin (mucinex): for cough and chest congestion  Honey or Zarbees: for cough / sore throat  Dextromethorphan (robitussin): for cough, especially nighttime cough  Oxymetazoline (afrin) nasal spray: for nasal congestion - DO NOT USE FOR MORE THAN 3 DAYS   Tylenol 325-500mg  every 6 hours as needed for fever, headache or body aches   If you have hypertension (high blood pressure), AVOID oral decongestants (phenylephrine/Sudafed) and products  that say "DM"   Follow-up within 1 week if symptoms do not improve or worsen.

## 2016-08-21 NOTE — Patient Instructions (Addendum)
Follow-up within 1 week if symptoms do not improve or worsen. Take medications as prescribed Symptomatic management with OTC meds as needed:  Guaifenesin (mucinex): for cough and chest congestion  Honey or Zarbees: for cough / sore throat  Dextromethorphan (robitussin): for cough, especially nighttime cough  Oxymetazoline (afrin) nasal spray: for nasal congestion - DO NOT USE FOR MORE THAN 3 DAYS   Tylenol 325-500mg  every 6 hours as needed for fever, headache or body aches   If you have hypertension (high blood pressure), AVOID oral decongestants (phenylephrine/Sudafed) and products that say "DM"

## 2016-09-29 ENCOUNTER — Telehealth: Payer: Self-pay | Admitting: Sports Medicine

## 2016-09-29 DIAGNOSIS — J3089 Other allergic rhinitis: Secondary | ICD-10-CM

## 2016-09-29 NOTE — Telephone Encounter (Signed)
Patient called advised that he seen you a couple months back for stuffy nose and he thought it would go away and it hasn't. Patient stated he doesn't feel bad just has continuous stuff nose for months now and would like to know if you could send a referral to an allergist for further evaluation. Patient stated he would prefer to go [place] in [place] if possible and he will be going on a business trip so any date after Jan 17th. Thanks

## 2016-09-29 NOTE — Telephone Encounter (Signed)
Happy to, ENT may be a better bet but referral placed to Allergist.

## 2016-11-25 ENCOUNTER — Other Ambulatory Visit: Payer: Self-pay | Admitting: Sports Medicine

## 2016-12-18 ENCOUNTER — Ambulatory Visit (INDEPENDENT_AMBULATORY_CARE_PROVIDER_SITE_OTHER): Payer: 59 | Admitting: Sports Medicine

## 2016-12-18 DIAGNOSIS — I10 Essential (primary) hypertension: Secondary | ICD-10-CM | POA: Diagnosis not present

## 2016-12-18 MED ORDER — ATORVASTATIN CALCIUM 20 MG PO TABS: 20.0000 mg | ORAL_TABLET | Freq: Every day | ORAL | 1 refills | Status: DC

## 2016-12-18 MED ORDER — LISINOPRIL 5 MG PO TABS: 5.0000 mg | ORAL_TABLET | Freq: Every day | ORAL | 3 refills | Status: DC

## 2016-12-18 NOTE — Progress Notes (Signed)
  Subjective:    CC: high blood pressure  HPI: Mr. Daryl Hayes is a 61 y.o. Male with a history of hyperlipidemia presents with high blood pressure. He says that his allergies have been bad and that the allergist will not administer his allergy shots until his blood pressure is better controlled. Pt denies episodes of headache, blurry vision, palpitations, or excessive sweating. Pt does note that he has gained 5-10 pounds in the last few months that he says may be contributing to his high blood pressure. He says he has been less active since the right infraspinatus tear, but thinks his physical activity should increase now that his shoulder is feeling better. He says that he has a home blood pressure cuff, which he will use to continue to monitor BP.  Past medical history:  Hyperlipidemia, right infraspinatus tendon tear.  See flowsheet/record as well for more information.  Surgical history: Negative.  See flowsheet/record as well for more information.  Family history: Negative.  See flowsheet/record as well for more information.  Social history: Negative.  See flowsheet/record as well for more information.  Allergies, and medications have been entered into the medical record, reviewed, and no changes needed.   Review of Systems: No fevers, chills, night sweats, weight loss, chest pain, or shortness of breath.   Objective:    General: Well Developed, well nourished, and in no acute distress.  Neuro: Alert and oriented x3, extra-ocular muscles intact, sensation grossly intact.  HEENT: Normocephalic, atraumatic, pupils equal round reactive to light, neck supple, no masses, no lymphadenopathy, thyroid nonpalpable.  Skin: Warm and dry, no rashes. Cardiac: Regular rate and rhythm, no murmurs rubs or gallops, no lower extremity edema.  Respiratory: Clear to auscultation bilaterally. Not using accessory muscles, speaking in full sentences.   Impression and Recommendations:    Mr. Daryl Hayes is a 61 y.o.  Male with a history of hyperlipidemia who presents with benign essential hypertension. Pt placed on lisinopril. We also discussed moderate physical activity of at least 30 minutes a day, and initiation of a DASH diet to control sodium intake.  Patient was provided with a note informing the allergist that it would be okay to proceed with the allergy shots as we are aggressively treating pt's HTN.  Follow-up in 2 weeks

## 2016-12-18 NOTE — Assessment & Plan Note (Signed)
Starting lisinopril 5 mg daily. I think he is safe to go ahead and do his allergy shots. Follow-up in 2 weeks. Recommended exercise prescription and DASH diet.

## 2016-12-18 NOTE — Patient Instructions (Signed)
DASH Eating Plan DASH stands for "Dietary Approaches to Stop Hypertension." The DASH eating plan is a healthy eating plan that has been shown to reduce high blood pressure (hypertension). It may also reduce your risk for type 2 diabetes, heart disease, and stroke. The DASH eating plan may also help with weight loss. What are tips for following this plan? General guidelines  Avoid eating more than 2,300 mg (milligrams) of salt (sodium) a day. If you have hypertension, you may need to reduce your sodium intake to 1,500 mg a day.  Limit alcohol intake to no more than 1 drink a day for nonpregnant women and 2 drinks a day for men. One drink equals 12 oz of beer, 5 oz of wine, or 1 oz of hard liquor.  Work with your health care provider to maintain a healthy body weight or to lose weight. Ask what an ideal weight is for you.  Get at least 30 minutes of exercise that causes your heart to beat faster (aerobic exercise) most days of the week. Activities may include walking, swimming, or biking.  Work with your health care provider or diet and nutrition specialist (dietitian) to adjust your eating plan to your individual calorie needs. Reading food labels  Check food labels for the amount of sodium per serving. Choose foods with less than 5 percent of the Daily Value of sodium. Generally, foods with less than 300 mg of sodium per serving fit into this eating plan.  To find whole grains, look for the word "whole" as the first word in the ingredient list. Shopping  Buy products labeled as "low-sodium" or "no salt added."  Buy fresh foods. Avoid canned foods and premade or frozen meals. Cooking  Avoid adding salt when cooking. Use salt-free seasonings or herbs instead of table salt or sea salt. Check with your health care provider or pharmacist before using salt substitutes.  Do not fry foods. Cook foods using healthy methods such as baking, boiling, grilling, and broiling instead.  Cook with  heart-healthy oils, such as olive, canola, soybean, or sunflower oil. Meal planning   Eat a balanced diet that includes: ? 5 or more servings of fruits and vegetables each day. At each meal, try to fill half of your plate with fruits and vegetables. ? Up to 6-8 servings of whole grains each day. ? Less than 6 oz of lean meat, poultry, or fish each day. A 3-oz serving of meat is about the same size as a deck of cards. One egg equals 1 oz. ? 2 servings of low-fat dairy each day. ? A serving of nuts, seeds, or beans 5 times each week. ? Heart-healthy fats. Healthy fats called Omega-3 fatty acids are found in foods such as flaxseeds and coldwater fish, like sardines, salmon, and mackerel.  Limit how much you eat of the following: ? Canned or prepackaged foods. ? Food that is high in trans fat, such as fried foods. ? Food that is high in saturated fat, such as fatty meat. ? Sweets, desserts, sugary drinks, and other foods with added sugar. ? Full-fat dairy products.  Do not salt foods before eating.  Try to eat at least 2 vegetarian meals each week.  Eat more home-cooked food and less restaurant, buffet, and fast food.  When eating at a restaurant, ask that your food be prepared with less salt or no salt, if possible. What foods are recommended? The items listed may not be a complete list. Talk with your dietitian about what   dietary choices are best for you. Grains Whole-grain or whole-wheat bread. Whole-grain or whole-wheat pasta. Brown rice. Oatmeal. Quinoa. Bulgur. Whole-grain and low-sodium cereals. Pita bread. Low-fat, low-sodium crackers. Whole-wheat flour tortillas. Vegetables Fresh or frozen vegetables (raw, steamed, roasted, or grilled). Low-sodium or reduced-sodium tomato and vegetable juice. Low-sodium or reduced-sodium tomato sauce and tomato paste. Low-sodium or reduced-sodium canned vegetables. Fruits All fresh, dried, or frozen fruit. Canned fruit in natural juice (without  added sugar). Meat and other protein foods Skinless chicken or turkey. Ground chicken or turkey. Pork with fat trimmed off. Fish and seafood. Egg whites. Dried beans, peas, or lentils. Unsalted nuts, nut butters, and seeds. Unsalted canned beans. Lean cuts of beef with fat trimmed off. Low-sodium, lean deli meat. Dairy Low-fat (1%) or fat-free (skim) milk. Fat-free, low-fat, or reduced-fat cheeses. Nonfat, low-sodium ricotta or cottage cheese. Low-fat or nonfat yogurt. Low-fat, low-sodium cheese. Fats and oils Soft margarine without trans fats. Vegetable oil. Low-fat, reduced-fat, or light mayonnaise and salad dressings (reduced-sodium). Canola, safflower, olive, soybean, and sunflower oils. Avocado. Seasoning and other foods Herbs. Spices. Seasoning mixes without salt. Unsalted popcorn and pretzels. Fat-free sweets. What foods are not recommended? The items listed may not be a complete list. Talk with your dietitian about what dietary choices are best for you. Grains Baked goods made with fat, such as croissants, muffins, or some breads. Dry pasta or rice meal packs. Vegetables Creamed or fried vegetables. Vegetables in a cheese sauce. Regular canned vegetables (not low-sodium or reduced-sodium). Regular canned tomato sauce and paste (not low-sodium or reduced-sodium). Regular tomato and vegetable juice (not low-sodium or reduced-sodium). Pickles. Olives. Fruits Canned fruit in a light or heavy syrup. Fried fruit. Fruit in cream or butter sauce. Meat and other protein foods Fatty cuts of meat. Ribs. Fried meat. Bacon. Sausage. Bologna and other processed lunch meats. Salami. Fatback. Hotdogs. Bratwurst. Salted nuts and seeds. Canned beans with added salt. Canned or smoked fish. Whole eggs or egg yolks. Chicken or turkey with skin. Dairy Whole or 2% milk, cream, and half-and-half. Whole or full-fat cream cheese. Whole-fat or sweetened yogurt. Full-fat cheese. Nondairy creamers. Whipped toppings.  Processed cheese and cheese spreads. Fats and oils Butter. Stick margarine. Lard. Shortening. Ghee. Bacon fat. Tropical oils, such as coconut, palm kernel, or palm oil. Seasoning and other foods Salted popcorn and pretzels. Onion salt, garlic salt, seasoned salt, table salt, and sea salt. Worcestershire sauce. Tartar sauce. Barbecue sauce. Teriyaki sauce. Soy sauce, including reduced-sodium. Steak sauce. Canned and packaged gravies. Fish sauce. Oyster sauce. Cocktail sauce. Horseradish that you find on the shelf. Ketchup. Mustard. Meat flavorings and tenderizers. Bouillon cubes. Hot sauce and Tabasco sauce. Premade or packaged marinades. Premade or packaged taco seasonings. Relishes. Regular salad dressings. Where to find more information:  National Heart, Lung, and Blood Institute: www.nhlbi.nih.gov  American Heart Association: www.heart.org Summary  The DASH eating plan is a healthy eating plan that has been shown to reduce high blood pressure (hypertension). It may also reduce your risk for type 2 diabetes, heart disease, and stroke.  With the DASH eating plan, you should limit salt (sodium) intake to 2,300 mg a day. If you have hypertension, you may need to reduce your sodium intake to 1,500 mg a day.  When on the DASH eating plan, aim to eat more fresh fruits and vegetables, whole grains, lean proteins, low-fat dairy, and heart-healthy fats.  Work with your health care provider or diet and nutrition specialist (dietitian) to adjust your eating plan to your individual   calorie needs. This information is not intended to replace advice given to you by your health care provider. Make sure you discuss any questions you have with your health care provider. Document Released: 08/28/2011 Document Revised: 09/01/2016 Document Reviewed: 09/01/2016 Elsevier Interactive Patient Education  2017 [employer]vier Inc.  

## 2017-04-12 ENCOUNTER — Other Ambulatory Visit: Payer: Self-pay | Admitting: Sports Medicine

## 2017-04-12 DIAGNOSIS — I10 Essential (primary) hypertension: Secondary | ICD-10-CM

## 2017-06-01 ENCOUNTER — Other Ambulatory Visit: Payer: Self-pay | Admitting: Sports Medicine

## 2017-07-03 ENCOUNTER — Encounter: Payer: Self-pay | Admitting: Sports Medicine

## 2017-07-03 ENCOUNTER — Ambulatory Visit (INDEPENDENT_AMBULATORY_CARE_PROVIDER_SITE_OTHER): Payer: 59 | Admitting: Sports Medicine

## 2017-07-03 DIAGNOSIS — I10 Essential (primary) hypertension: Secondary | ICD-10-CM

## 2017-07-03 DIAGNOSIS — R0982 Postnasal drip: Secondary | ICD-10-CM

## 2017-07-03 MED ORDER — HYDROCOD POLST-CPM POLST ER 10-8 MG/5ML PO SUER: 5.0000 mL | Freq: Two times a day (BID) | ORAL | 0 refills | Status: DC | PRN

## 2017-07-03 MED ORDER — FLUTICASONE PROPIONATE 50 MCG/ACT NA SUSP: NASAL | 3 refills | Status: DC

## 2017-07-03 NOTE — Assessment & Plan Note (Signed)
Does need routine blood work checked

## 2017-07-03 NOTE — Assessment & Plan Note (Signed)
Adding Flonase, Tussionex.  Does have some mild anosmia and dysgeusia likely from his rhinitis. Return as needed for this.

## 2017-07-03 NOTE — Progress Notes (Signed)
  Subjective:    CC: Feeling sick  HPI: For the past 9 days this pleasant 61 year old male has had a cough, runny nose, and the inability to smell or taste. He denies any fevers, chills, no shortness of breath, cough is mostly at night and nonproductive, no myalgias, no vomiting, nausea, diarrhea, no skin rash. Mild, persistent.  Past medical history:  Negative.  See flowsheet/record as well for more information.  Surgical history: Negative.  See flowsheet/record as well for more information.  Family history: Negative.  See flowsheet/record as well for more information.  Social history: Negative.  See flowsheet/record as well for more information.  Allergies, and medications have been entered into the medical record, reviewed, and no changes needed.   Review of Systems: No fevers, chills, night sweats, weight loss, chest pain, or shortness of breath.   Objective:    General: Well Developed, well nourished, and in no acute distress.  Neuro: Alert and oriented x3, extra-ocular muscles intact, sensation grossly intact.  HEENT: Normocephalic, atraumatic, pupils equal round reactive to light, neck supple, no masses, no lymphadenopathy, thyroid nonpalpable. Oropharynx, ear canals unremarkable, nasopharynx shows boggy and erythematous turbinate. Skin: Warm and dry, no rashes. Cardiac: Regular rate and rhythm, no murmurs rubs or gallops, no lower extremity edema.  Respiratory: Clear to auscultation bilaterally. Not using accessory muscles, speaking in full sentences.  Impression and Recommendations:    Postnasal drip Adding Flonase, Tussionex.  Does have some mild anosmia and dysgeusia likely from his rhinitis. Return as needed for this.  Benign essential hypertension Does need routine blood work checked  ___________________________________________ Ihor Austin. Benjamin Stain, M.D., ABFM., CAQSM. Primary Care and Sports Medicine [facility]  Adjunct Instructor of  Family Medicine  [employer]

## 2017-08-02 ENCOUNTER — Other Ambulatory Visit: Payer: Self-pay | Admitting: Sports Medicine

## 2017-08-02 DIAGNOSIS — I10 Essential (primary) hypertension: Secondary | ICD-10-CM

## 2018-01-27 ENCOUNTER — Other Ambulatory Visit: Payer: Self-pay | Admitting: Sports Medicine

## 2018-03-07 ENCOUNTER — Other Ambulatory Visit: Payer: Self-pay | Admitting: Sports Medicine

## 2018-03-07 DIAGNOSIS — I10 Essential (primary) hypertension: Secondary | ICD-10-CM

## 2018-03-10 ENCOUNTER — Encounter: Payer: Self-pay | Admitting: Sports Medicine

## 2018-03-10 ENCOUNTER — Ambulatory Visit (INDEPENDENT_AMBULATORY_CARE_PROVIDER_SITE_OTHER): Payer: 59 | Admitting: Sports Medicine

## 2018-03-10 DIAGNOSIS — E785 Hyperlipidemia, unspecified: Secondary | ICD-10-CM | POA: Diagnosis not present

## 2018-03-10 DIAGNOSIS — R197 Diarrhea, unspecified: Secondary | ICD-10-CM | POA: Insufficient documentation

## 2018-03-10 DIAGNOSIS — K591 Functional diarrhea: Secondary | ICD-10-CM | POA: Diagnosis not present

## 2018-03-10 DIAGNOSIS — I10 Essential (primary) hypertension: Secondary | ICD-10-CM

## 2018-03-10 DIAGNOSIS — R0982 Postnasal drip: Secondary | ICD-10-CM

## 2018-03-10 MED ORDER — PSYLLIUM 58.6 % PO POWD: 1.0000 | Freq: Three times a day (TID) | ORAL | 12 refills | Status: AC

## 2018-03-10 MED ORDER — LISINOPRIL 10 MG PO TABS: 10.0000 mg | ORAL_TABLET | Freq: Every day | ORAL | 3 refills | Status: AC

## 2018-03-10 NOTE — Assessment & Plan Note (Signed)
Questionable compliance with atorvastatin, he will work on taking it every day.

## 2018-03-10 NOTE — Assessment & Plan Note (Addendum)
Increasingly lisinopril to 10 mg daily.

## 2018-03-10 NOTE — Assessment & Plan Note (Signed)
Persistence of symptoms, we have tried Flonase, Tussionex, he does have some mild anosmia and dysgeusia likely from chronic rhinitis. Considering persistence of symptoms I would like him to touch base with ENT to ensure no nasal polyps.  He does have mostly obstructive symptoms on the left side.

## 2018-03-10 NOTE — Progress Notes (Signed)
Subjective:    CC: Diarrhea  HPI: This is a pleasant 62 year old male, with past several months he is noted episodes where he will often need to stool several times per day, occasional diarrhea mixed with formed stools.  No blood, no mucus.  No change in his eating habits with the exception of likely a low fiber diet.  Does not really notice much increase in tenesmus and stooling when he is anxious.  No change in weight, appetite.  Symptoms are mild, persistent.  Hypertension: Somewhat elevated today, only on lisinopril 5, no headaches, visual changes, chest pain.  Hyperlipidemia: Slightly uncontrolled, noncompliant with his atorvastatin.  Postnasal drip: With nasal congestion, left worse than right, mild anosmia and dysgeusia.  Has not responded to several months of intranasal fluticasone.  I reviewed the past medical history, family history, social history, surgical history, and allergies today and no changes were needed.  Please see the problem list section below in epic for further details.  Past Medical History: Past Medical History:  Diagnosis Date  . Hyperlipidemia    Past Surgical History: Past Surgical History:  Procedure Laterality Date  . INGUINAL HERNIA REPAIR    . VASECTOMY    . WRIST SURGERY     Social History: Social History   Socioeconomic History  . Marital status: Married    Spouse name: Not on file  . Number of children: Not on file  . Years of education: Not on file  . Highest education level: Not on file  Occupational History  . Not on file  Social Needs  . Financial resource strain: Not on file  . Food insecurity:    Worry: Not on file    Inability: Not on file  . Transportation needs:    Medical: Not on file    Non-medical: Not on file  Tobacco Use  . Smoking status: Never Smoker  . Smokeless tobacco: Never Used  Substance and Sexual Activity  . Alcohol use: Yes  . Drug use: No  . Sexual activity: Yes  Lifestyle  . Physical activity:   Days per week: Not on file    Minutes per session: Not on file  . Stress: Not on file  Relationships  . Social connections:    Talks on phone: Not on file    Gets together: Not on file    Attends religious service: Not on file    Active member of club or organization: Not on file    Attends meetings of clubs or organizations: Not on file    Relationship status: Not on file  Other Topics Concern  . Not on file  Social History Narrative  . Not on file   Family History: No family history on file. Allergies: Allergies  Allergen Reactions  . Oxycodone Anxiety   Medications: See med rec.  Review of Systems: No fevers, chills, night sweats, weight loss, chest pain, or shortness of breath.   Objective:    General: Well Developed, well nourished, and in no acute distress.  Neuro: Alert and oriented x3, extra-ocular muscles intact, sensation grossly intact.  HEENT: Normocephalic, atraumatic, pupils equal round reactive to light, neck supple, no masses, no lymphadenopathy, thyroid nonpalpable.  Skin: Warm and dry, no rashes. Cardiac: Regular rate and rhythm, no murmurs rubs or gallops, no lower extremity edema.  Respiratory: Clear to auscultation bilaterally. Not using accessory muscles, speaking in full sentences. Abdomen: Soft, nontender, nontender, no bowel sounds, palpable masses, guarding, rigidity, rebound tenderness.  Impression and Recommendations:  Postnasal drip Persistence of symptoms, we have tried Flonase, Tussionex, he does have some mild anosmia and dysgeusia likely from chronic rhinitis. Considering persistence of symptoms I would like him to touch base with ENT to ensure no nasal polyps.  He does have mostly obstructive symptoms on the left side.  Hyperlipidemia Questionable compliance with atorvastatin, he will work on taking it every day.  Diarrhea Suspect irritable bowel syndrome. Patient will increase fiber consumption to 3 times per day. If insufficient  relief after a month we will probably need to do stool studies. No melena, hematochezia to suggest a gastritis or neoplastic cause in the colon. Ultimately if we need a colonoscopy he says he would rather do it after September. He is unfortunately moving to [facility] after next week.  Benign essential hypertension Increasingly lisinopril to 10 mg daily. ___________________________________________ Ihor Austin. Benjamin Stain, M.D., ABFM., CAQSM. Primary Care and Sports Medicine [facility]  Adjunct Instructor of Family Medicine  [employer]

## 2018-03-10 NOTE — Patient Instructions (Signed)
Irritable Bowel Syndrome, Adult Irritable bowel syndrome (IBS) is not one specific disease. It is a group of symptoms that affects the organs responsible for digestion (gastrointestinal or GI tract). To regulate how your GI tract works, your body sends signals back and forth between your intestines and your brain. If you have IBS, there may be a problem with these signals. As a result, your GI tract does not function normally. Your intestines may become more sensitive and overreact to certain things. This is especially true when you eat certain foods or when you are under stress. There are four types of IBS. These may be determined based on the consistency of your stool:  IBS with diarrhea.  IBS with constipation.  Mixed IBS.  Unsubtyped IBS.  It is important to know which type of IBS you have. Some treatments are more likely to be helpful for certain types of IBS. What are the causes? The exact cause of IBS is not known. What increases the risk? You may have a higher risk of IBS if:  You are a woman.  You are younger than 61 years old.  You have a family history of IBS.  You have mental health problems.  You have had bacterial infection of your GI tract.  What are the signs or symptoms? Symptoms of IBS vary from person to person. The main symptom is abdominal pain or discomfort. Additional symptoms usually include one or more of the following:  Diarrhea, constipation, or both.  Abdominal swelling or bloating.  Feeling full or sick after eating a small or regular-size meal.  Frequent gas.  Mucus in the stool.  A feeling of having more stool left after a bowel movement.  Symptoms tend to come and go. They may be associated with stress, psychiatric conditions, or nothing at all. How is this diagnosed? There is no specific test to diagnose IBS. Your health care provider will make a diagnosis based on a physical exam, medical history, and your symptoms. You may have other  tests to rule out other conditions that may be causing your symptoms. These may include:  Blood tests.  X-rays.  CT scan.  Endoscopy and colonoscopy. This is a test in which your GI tract is viewed with a long, thin, flexible tube.  How is this treated? There is no cure for IBS, but treatment can help relieve symptoms. IBS treatment often includes:  Changes to your diet, such as: ? Eating more fiber. ? Avoiding foods that cause symptoms. ? Drinking more water. ? Eating regular, medium-sized portioned meals.  Medicines. These may include: ? Fiber supplements if you have constipation. ? Medicine to control diarrhea (antidiarrheal medicines). ? Medicine to help control muscle spasms in your GI tract (antispasmodic medicines). ? Medicines to help with any mental health issues, such as antidepressants or tranquilizers.  Therapy. ? Talk therapy may help with anxiety, depression, or other mental health issues that can make IBS symptoms worse.  Stress reduction. ? Managing your stress can help keep symptoms under control.  Follow these instructions at home:  Take medicines only as directed by your health care provider.  Eat a healthy diet. ? Avoid foods and drinks with added sugar. ? Include more whole grains, fruits, and vegetables gradually into your diet. This may be especially helpful if you have IBS with constipation. ? Avoid any foods and drinks that make your symptoms worse. These may include dairy products and caffeinated or carbonated drinks. ? Do not eat large meals. ? Drink enough   fluid to keep your urine clear or pale yellow.  Exercise regularly. Ask your health care provider for recommendations of good activities for you.  Keep all follow-up visits as directed by your health care provider. This is important. Contact a health care provider if:  You have constant pain.  You have trouble or pain with swallowing.  You have worsening diarrhea. Get help right away  if:  You have severe and worsening abdominal pain.  You have diarrhea and: ? You have a rash, stiff neck, or severe headache. ? You are irritable, sleepy, or difficult to awaken. ? You are weak, dizzy, or extremely thirsty.  You have bright red blood in your stool or you have black tarry stools.  You have unusual abdominal swelling that is painful.  You vomit continuously.  You vomit blood (hematemesis).  You have both abdominal pain and a fever. This information is not intended to replace advice given to you by your health care provider. Make sure you discuss any questions you have with your health care provider. Document Released: 09/08/2005 Document Revised: 02/08/2016 Document Reviewed: 05/26/2014 Elsevier Interactive Patient Education  2018 [employer]  

## 2018-03-10 NOTE — Assessment & Plan Note (Signed)
Suspect irritable bowel syndrome. Patient will increase fiber consumption to 3 times per day. If insufficient relief after a month we will probably need to do stool studies. No melena, hematochezia to suggest a gastritis or neoplastic cause in the colon. Ultimately if we need a colonoscopy he says he would rather do it after September. [place] [facility] [place].

## 2018-06-22 LAB — CBC
HCT: 41 % (ref 38.5–50.0)
Hemoglobin: 14.6 g/dL (ref 13.2–17.1)
MCH: 31 pg (ref 27.0–33.0)
MCHC: 35.6 g/dL (ref 32.0–36.0)
MCV: 87 fL (ref 80.0–100.0)
MPV: 10.3 fL (ref 7.5–12.5)
Platelets: 218 10*3/uL (ref 140–400)
RBC: 4.71 10*6/uL (ref 4.20–5.80)
RDW: 12.2 % (ref 11.0–15.0)
WBC: 4.5 10*3/uL (ref 3.8–10.8)

## 2018-06-22 LAB — HEPATITIS C ANTIBODY
Hepatitis C Ab: NONREACTIVE
SIGNAL TO CUT-OFF: 0.1 (ref <1.00)

## 2018-06-22 LAB — COMPREHENSIVE METABOLIC PANEL
AG Ratio: 1.8 (calc) (ref 1.0–2.5)
Albumin: 4.5 g/dL (ref 3.6–5.1)
Alkaline phosphatase (APISO): 47 U/L (ref 40–115)
BUN: 20 mg/dL (ref 7–25)
Chloride: 103 mmol/L (ref 98–110)
Creat: 1.01 mg/dL (ref 0.70–1.25)
Globulin: 2.5 g/dL (calc) (ref 1.9–3.7)
Glucose, Bld: 107 mg/dL — ABNORMAL HIGH (ref 65–99)
Potassium: 4.6 mmol/L (ref 3.5–5.3)
Sodium: 136 mmol/L (ref 135–146)
Total Protein: 7 g/dL (ref 6.1–8.1)

## 2018-06-22 LAB — COMPREHENSIVE METABOLIC PANEL WITH GFR
ALT: 30 U/L (ref 9–46)
AST: 21 U/L (ref 10–35)
CO2: 25 mmol/L (ref 20–32)
Calcium: 9.7 mg/dL (ref 8.6–10.3)
Total Bilirubin: 0.8 mg/dL (ref 0.2–1.2)

## 2018-06-22 LAB — ADVANCED WRITTEN NOTIFICATION (AWN) TEST REFUSAL: AWN TEST REFUSED: 17306

## 2018-06-22 LAB — HEMOGLOBIN A1C
Hgb A1c MFr Bld: 5.4 %{Hb} (ref <5.7)
Mean Plasma Glucose: 108 (calc)
eAG (mmol/L): 6 (calc)

## 2018-06-22 LAB — LIPID PANEL W/REFLEX DIRECT LDL
Cholesterol: 209 mg/dL — ABNORMAL HIGH (ref <200)
HDL: 65 mg/dL (ref >40)
LDL Cholesterol (Calc): 118 mg/dL (calc) — ABNORMAL HIGH
Non-HDL Cholesterol (Calc): 144 mg/dL — ABNORMAL HIGH (ref <130)
Total CHOL/HDL Ratio: 3.2 (calc) (ref <5.0)
Triglycerides: 144 mg/dL (ref <150)

## 2018-06-22 LAB — HIV ANTIBODY (ROUTINE TESTING W REFLEX): HIV 1&2 Ab, 4th Generation: NONREACTIVE

## 2018-07-21 ENCOUNTER — Other Ambulatory Visit: Payer: Self-pay | Admitting: Sports Medicine

## 2018-10-23 IMAGING — DX DG CHEST 2V
2 series · 2 of 2 positions shown · non-contrast
Comparison: November 06, 2014

CLINICAL DATA: Cough and wheezing

EXAM:
CHEST  2 VIEW

[chest pa]
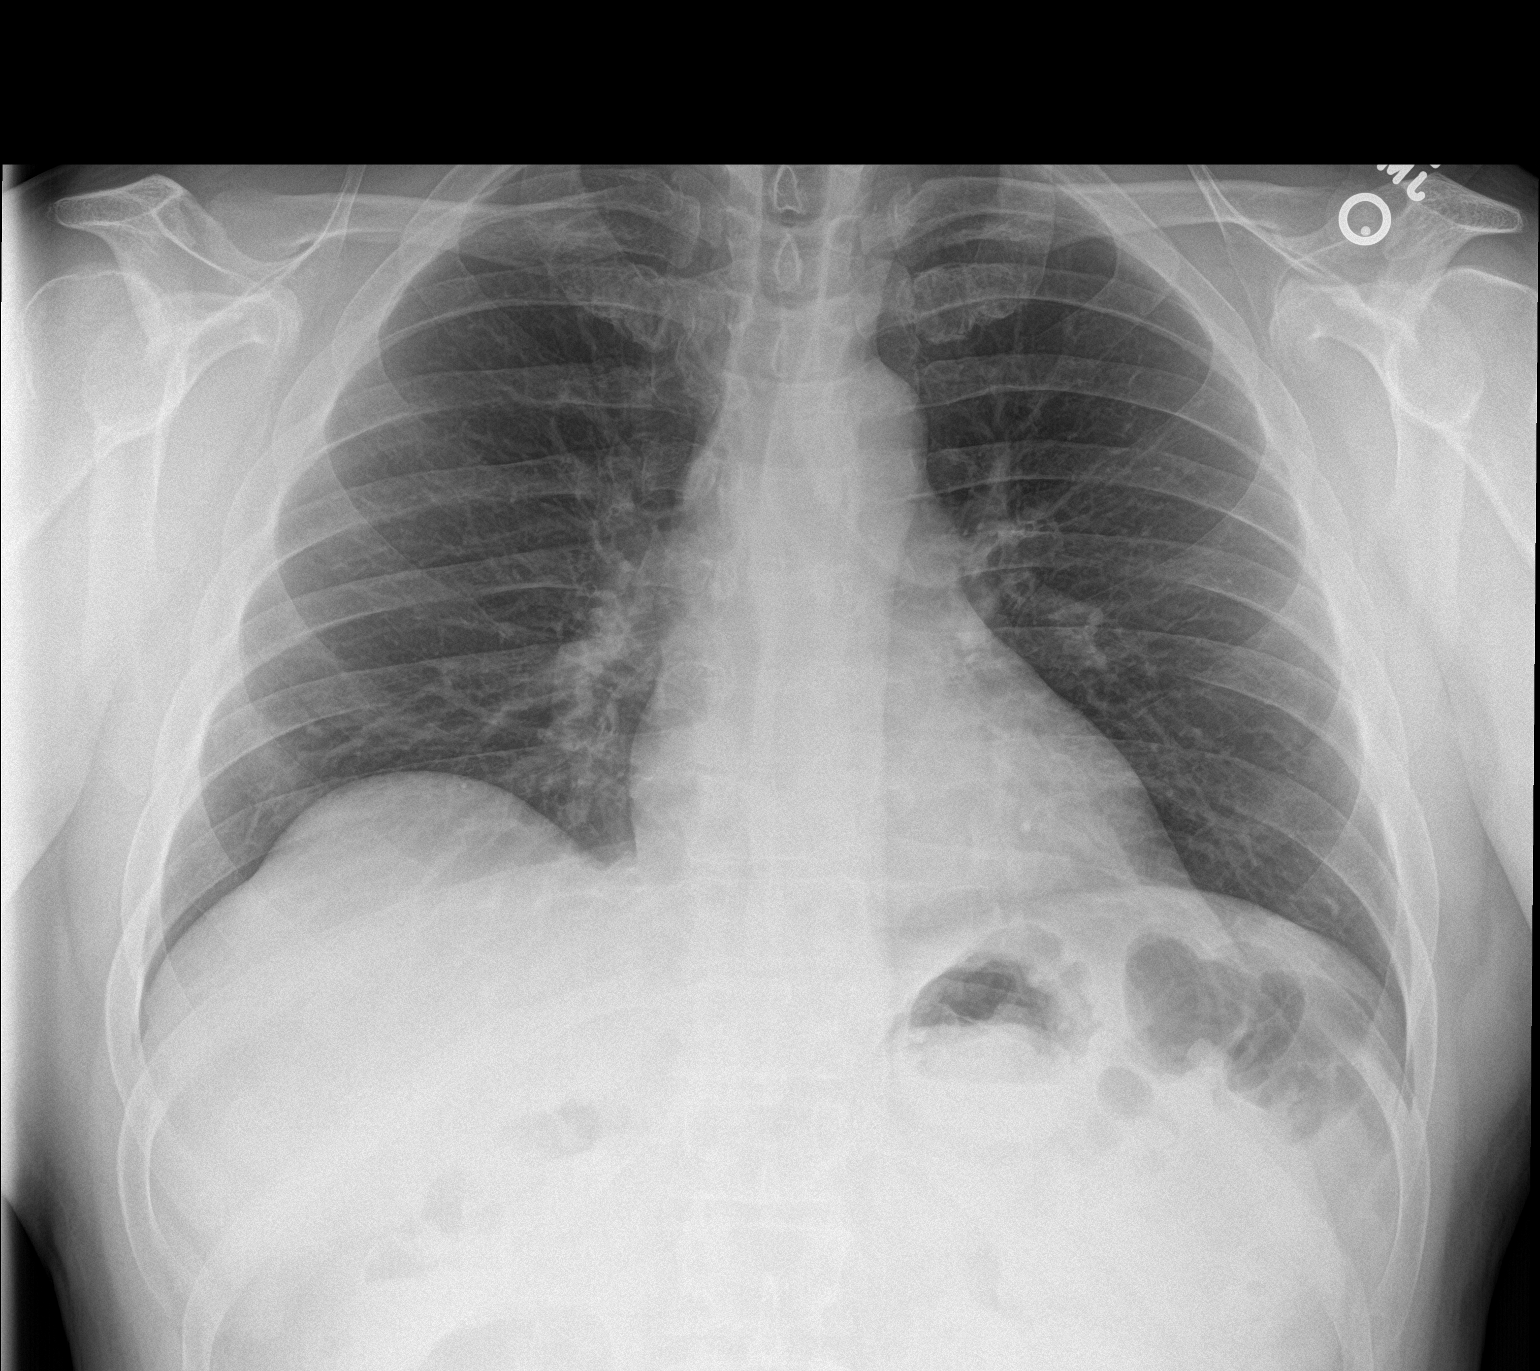

[chest lat]
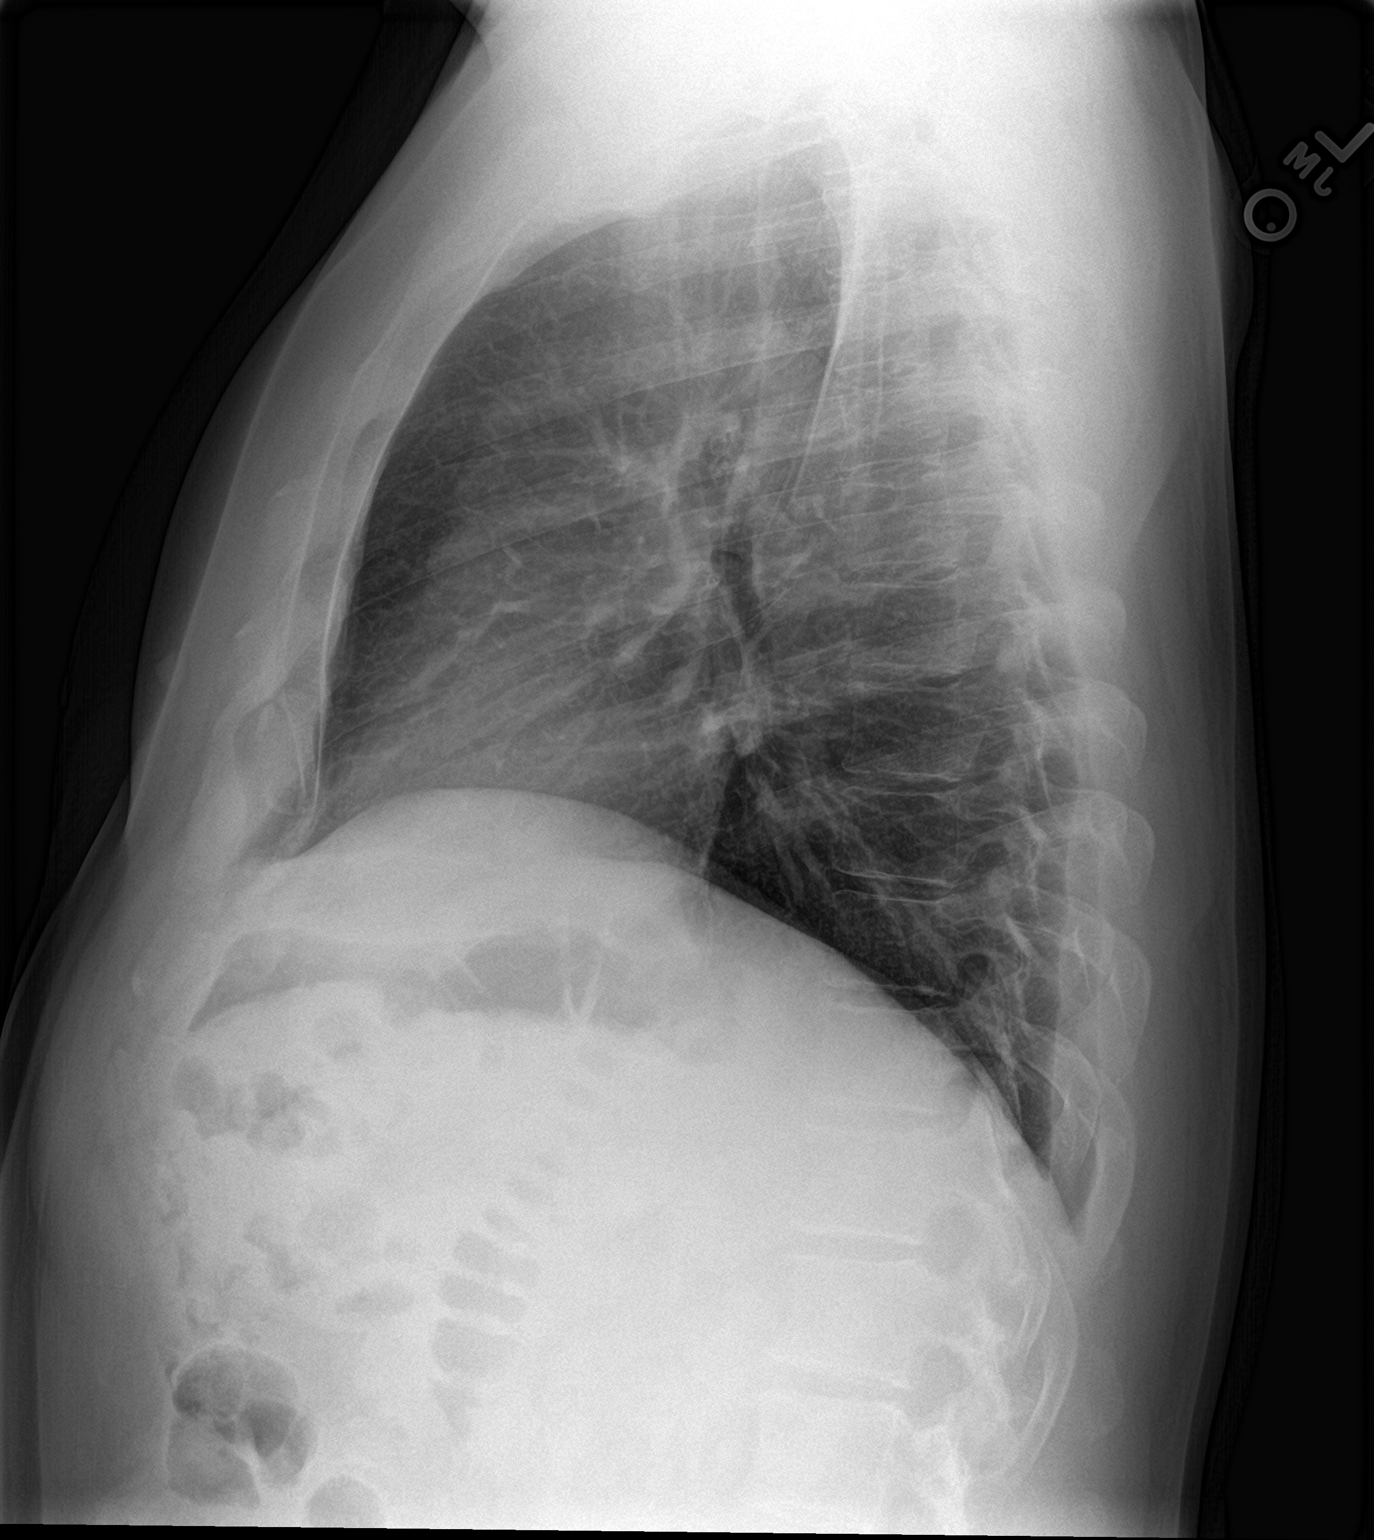

[2 of 2 positions shown; findings below may reference images not displayed]

FINDINGS: There is no edema or consolidation. The heart size and pulmonary
vascularity are normal. No adenopathy. No bone lesions.
IMPRESSION: No edema or consolidation.

## 2019-07-21 ENCOUNTER — Other Ambulatory Visit: Payer: Self-pay | Admitting: Sports Medicine
